# Patient Record
Sex: Female | Born: 1970 | Hispanic: Refuse to answer | Marital: Single
Health system: Southern US, Community
[De-identification: ages and names within clinical notes are randomized; demographics above are authoritative.]

---

## 2003-11-04 ENCOUNTER — Emergency Department (HOSPITAL_COMMUNITY): Admission: EM | Admit: 2003-11-04 | Discharge: 2003-11-04 | Payer: Self-pay | Admitting: Emergency Medicine

## 2004-03-23 ENCOUNTER — Emergency Department (HOSPITAL_COMMUNITY): Admission: EM | Admit: 2004-03-23 | Discharge: 2004-03-23 | Payer: Self-pay | Admitting: Family Medicine

## 2004-03-28 ENCOUNTER — Emergency Department (HOSPITAL_COMMUNITY): Admission: EM | Admit: 2004-03-28 | Discharge: 2004-03-28 | Payer: Self-pay | Admitting: Family Medicine

## 2005-06-17 ENCOUNTER — Emergency Department: Payer: Self-pay | Admitting: Internal Medicine

## 2005-06-20 ENCOUNTER — Emergency Department: Payer: Self-pay | Admitting: Emergency Medicine

## 2006-11-27 ENCOUNTER — Ambulatory Visit: Payer: Self-pay | Admitting: Internal Medicine

## 2009-12-28 ENCOUNTER — Emergency Department: Payer: Self-pay | Admitting: Emergency Medicine

## 2009-12-31 ENCOUNTER — Emergency Department (HOSPITAL_COMMUNITY): Admission: EM | Admit: 2009-12-31 | Discharge: 2009-12-31 | Payer: Self-pay | Admitting: Emergency Medicine

## 2018-12-21 ENCOUNTER — Emergency Department: Payer: Self-pay

## 2018-12-21 ENCOUNTER — Encounter: Payer: Self-pay | Admitting: Emergency Medicine

## 2018-12-21 ENCOUNTER — Other Ambulatory Visit: Payer: Self-pay

## 2018-12-21 ENCOUNTER — Emergency Department
Admission: EM | Admit: 2018-12-21 | Discharge: 2018-12-21 | Disposition: A | Discharge status: Home / Self Care | Payer: Self-pay | Attending: Emergency Medicine | Admitting: Emergency Medicine

## 2018-12-21 DIAGNOSIS — Y92410 Unspecified street and highway as the place of occurrence of the external cause: Secondary | ICD-10-CM | POA: Insufficient documentation

## 2018-12-21 DIAGNOSIS — S161XXA Strain of muscle, fascia and tendon at neck level, initial encounter: Secondary | ICD-10-CM | POA: Insufficient documentation

## 2018-12-21 DIAGNOSIS — S301XXA Contusion of abdominal wall, initial encounter: Secondary | ICD-10-CM | POA: Insufficient documentation

## 2018-12-21 DIAGNOSIS — Y999 Unspecified external cause status: Secondary | ICD-10-CM | POA: Insufficient documentation

## 2018-12-21 DIAGNOSIS — S8001XA Contusion of right knee, initial encounter: Secondary | ICD-10-CM | POA: Insufficient documentation

## 2018-12-21 DIAGNOSIS — Y939 Activity, unspecified: Secondary | ICD-10-CM | POA: Insufficient documentation

## 2018-12-21 LAB — COMPREHENSIVE METABOLIC PANEL
ALT: 18 U/L (ref 0–44)
AST: 28 U/L (ref 15–41)
Albumin: 3.8 g/dL (ref 3.5–5.0)
Alkaline Phosphatase: 83 U/L (ref 38–126)
Anion gap: 10 (ref 5–15)
BUN: 17 mg/dL (ref 6–20)
CO2: 23 mmol/L (ref 22–32)
Calcium: 8.7 mg/dL — ABNORMAL LOW (ref 8.9–10.3)
Chloride: 106 mmol/L (ref 98–111)
Creatinine, Ser: 1.02 mg/dL — ABNORMAL HIGH (ref 0.44–1.00)
GFR calc Af Amer: >60 mL/min (ref >60)
GFR calc non Af Amer: >60 mL/min (ref >60)
Glucose, Bld: 97 mg/dL (ref 70–99)
Potassium: 3.8 mmol/L (ref 3.5–5.1)
Sodium: 139 mmol/L (ref 135–145)
Total Bilirubin: 0.6 mg/dL (ref 0.3–1.2)
Total Protein: 7.7 g/dL (ref 6.5–8.1)

## 2018-12-21 LAB — CBC WITH DIFFERENTIAL/PLATELET
Abs Immature Granulocytes: 0.05 10*3/uL (ref 0.00–0.07)
Basophils Absolute: 0.1 10*3/uL (ref 0.0–0.1)
Basophils Relative: 1 %
Eosinophils Absolute: 0.2 10*3/uL (ref 0.0–0.5)
Eosinophils Relative: 1 %
HCT: 35.4 % — ABNORMAL LOW (ref 36.0–46.0)
Hemoglobin: 11.2 g/dL — ABNORMAL LOW (ref 12.0–15.0)
Immature Granulocytes: 0 %
Lymphocytes Relative: 16 %
Lymphs Abs: 2.2 10*3/uL (ref 0.7–4.0)
MCH: 26.1 pg (ref 26.0–34.0)
MCHC: 31.6 g/dL (ref 30.0–36.0)
MCV: 82.5 fL (ref 80.0–100.0)
Monocytes Absolute: 0.7 10*3/uL (ref 0.1–1.0)
Monocytes Relative: 5 %
Neutro Abs: 10.7 10*3/uL — ABNORMAL HIGH (ref 1.7–7.7)
Neutrophils Relative %: 77 %
Platelets: 492 10*3/uL — ABNORMAL HIGH (ref 150–400)
RBC: 4.29 MIL/uL (ref 3.87–5.11)
RDW: 15.3 % (ref 11.5–15.5)
WBC: 13.9 10*3/uL — ABNORMAL HIGH (ref 4.0–10.5)
nRBC: 0 % (ref 0.0–0.2)

## 2018-12-21 MED ORDER — IOHEXOL 300 MG/ML  SOLN
100.0000 mL | Freq: Once | INTRAMUSCULAR | Status: AC | PRN
  Administered 2018-12-21: 20:00:00 100 mL via INTRAVENOUS
  Filled 2018-12-21: qty 100

## 2018-12-21 MED ORDER — HYDROCODONE-ACETAMINOPHEN 5-325 MG PO TABS: 1.0000 | ORAL_TABLET | ORAL | 0 refills | Status: AC | PRN

## 2018-12-21 MED ORDER — MELOXICAM 15 MG PO TABS: 15.0000 mg | ORAL_TABLET | Freq: Every day | ORAL | 0 refills | Status: AC

## 2018-12-21 MED ORDER — ONDANSETRON HCL 4 MG/2ML IJ SOLN
4.0000 mg | Freq: Once | INTRAMUSCULAR | Status: AC
  Administered 2018-12-21: 20:00:00 4 mg via INTRAVENOUS
  Filled 2018-12-21: qty 2

## 2018-12-21 MED ORDER — MORPHINE SULFATE (PF) 4 MG/ML IV SOLN
4.0000 mg | Freq: Once | INTRAVENOUS | Status: AC
  Administered 2018-12-21: 20:00:00 4 mg via INTRAVENOUS
  Filled 2018-12-21: qty 1

## 2018-12-21 MED ORDER — METHOCARBAMOL 500 MG PO TABS: 500.0000 mg | ORAL_TABLET | Freq: Four times a day (QID) | ORAL | 0 refills | Status: AC

## 2018-12-21 MED ORDER — SODIUM CHLORIDE 0.9 % IV BOLUS
1000.0000 mL | Freq: Once | INTRAVENOUS | Status: AC
  Administered 2018-12-21: 20:00:00 1000 mL via INTRAVENOUS
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
## 2018-12-21 NOTE — ED Notes (Signed)
PA-C aware of patient's pain rating.
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
